# Patient Record
Sex: Male | Born: 1980 | Race: Black or African American | Hispanic: No | Smoking: Current every day smoker
Health system: Southern US, Community
[De-identification: ages and names within clinical notes are randomized; demographics above are authoritative.]

---

## 2021-07-13 ENCOUNTER — Ambulatory Visit
Admission: EM | Admit: 2021-07-13 | Discharge: 2021-07-13 | Disposition: A | Discharge status: Home / Self Care | Payer: BC Managed Care – PPO | Attending: Physician Assistant | Admitting: Physician Assistant

## 2021-07-13 ENCOUNTER — Ambulatory Visit (INDEPENDENT_AMBULATORY_CARE_PROVIDER_SITE_OTHER): Payer: BC Managed Care – PPO

## 2021-07-13 ENCOUNTER — Other Ambulatory Visit: Payer: Self-pay

## 2021-07-13 ENCOUNTER — Encounter: Payer: Self-pay | Admitting: Emergency Medicine

## 2021-07-13 DIAGNOSIS — M25512 Pain in left shoulder: Secondary | ICD-10-CM | POA: Diagnosis not present

## 2021-07-13 DIAGNOSIS — W19XXXA Unspecified fall, initial encounter: Secondary | ICD-10-CM | POA: Diagnosis not present

## 2021-07-13 MED ORDER — NAPROXEN 500 MG PO TABS: 500.0000 mg | ORAL_TABLET | Freq: Two times a day (BID) | ORAL | 0 refills | Status: AC

## 2021-07-13 NOTE — ED Triage Notes (Signed)
Pt c/o left shoulder pain. Started about 2 days ago. He states he was running at the pool and slipped on concrete and landed on his shoulder. He is unable to move shoulder at all.

## 2021-07-13 NOTE — Discharge Instructions (Addendum)
-  Naproxen, 1 tablet every 12 hours for pain inflammation -Warm compresses few times a day -Other care and range of motion exercises as attached -arm sling while upright for support -If no improvement in 4 to 5 days, would follow-up with orthopedics.  [facility] does have an orthopedic urgent care.

## 2021-07-13 NOTE — ED Provider Notes (Signed)
[facility]    CSN: [id number] Arrival date & time: 07/13/21  7035      History   Chief Complaint Chief Complaint  Patient presents with   Shoulder Injury    left    HPI Alec Watkins is a 40 y.o. male.   Patient is a 40 year old male who presents with complaint of left shoulder pain.  Patient states he was at the pool on Saturday when he fell, landing on his left shoulder.  Patient states he is unable to move the shoulder.  He is not taking any medications for it but has to use icy hot which only works for a very short time.  Patient has used no cold or heat compresses.  Patient denies any previous injury to the shoulder.  Patient does report some minimal tingling in his fingers.   History reviewed. No pertinent past medical history.  There are no problems to display for this patient.   History reviewed. No pertinent surgical history.   Home Medications    Prior to Admission medications   Medication Sig Start Date End Date Taking? Authorizing Provider  naproxen (NAPROSYN) 500 MG tablet Take 1 tablet (500 mg total) by mouth 2 (two) times daily. 07/13/21  Yes Candis Schatz, PA-C    Family History No family history on file.  Social History Social History   Tobacco Use   Smoking status: Every Day    Packs/day: 0.50    Types: Cigarettes   Smokeless tobacco: Never  Vaping Use   Vaping Use: Never used  Substance Use Topics   Alcohol use: Not Currently   Drug use: Not Currently     Allergies   Patient has no known allergies.   Review of Systems Review of Systems as noted above in HPI.  Other systems reviewed and found to be negative   Physical Exam Triage Vital Signs ED Triage Vitals  Enc Vitals Group     BP 07/13/21 0915 (!) 145/88     Pulse Rate 07/13/21 0915 93     Resp 07/13/21 0915 18     Temp 07/13/21 0915 98.9 F (37.2 C)     Temp Source 07/13/21 0915 Oral     SpO2 07/13/21 0915 97 %     Weight 07/13/21 0913 270 lb (122.5 kg)      Height 07/13/21 0913 6\' 4"  (1.93 m)     Head Circumference --      Watkins Flow --      Pain Score 07/13/21 0913 10     Pain Loc --      Pain Edu? --      Excl. in GC? --    No data found.  Updated Vital Signs BP (!) 145/88 (BP Location: Right Arm)   Pulse 93   Temp 98.9 F (37.2 C) (Oral)   Resp 18   Ht 6\' 4"  (1.93 m)   Wt 270 lb (122.5 kg)   SpO2 97%   BMI 32.87 kg/m     Physical Exam Constitutional:      Appearance: Normal appearance. He is not ill-appearing.  Pulmonary:     Effort: Pulmonary effort is normal.  Musculoskeletal:     Right shoulder: Normal.     Left shoulder: Tenderness and bony tenderness present. Decreased range of motion.     Cervical back: Normal range of motion.     Comments: Left shoulder with tenderness to palpation of the shoulder joint itself.  Decreased range of motion.  Less discomfort with backward extension of the shoulder joint.  Worse with abduction.  Skin:    General: Skin is warm and dry.  Neurological:     General: No focal deficit present.     Mental Status: He is alert and oriented to person, place, and time.     UC Treatments / Results  Labs (all labs ordered are listed, but only abnormal results are displayed) Labs Reviewed - No data to display  EKG   Radiology DG Shoulder Left  Result Date: 07/13/2021 CLINICAL DATA:  Left shoulder pain after fall 2 days ago. EXAM: LEFT SHOULDER - 2+ VIEW COMPARISON:  None. FINDINGS: There is no evidence of fracture or dislocation. There is no evidence of arthropathy or other focal bone abnormality. Soft tissues are unremarkable. IMPRESSION: Negative. Electronically Signed   By: Obie Dredge M.D.   On: 07/13/2021 09:45    Procedures Procedures (including critical care time)  Medications Ordered in UC Medications - No data to display  Initial Impression / Assessment and Plan / UC Course  I have reviewed the triage vital signs and the nursing notes.  Pertinent labs & imaging  results that were available during my care of the patient were reviewed by me and considered in my medical decision making (see chart for details).    Patient presents with left shoulder pain after reportedly slipping and falling at the pool, landing on the concrete on his left shoulder.  Patient with decreased range of motion.  Tenderness palpation of the shoulder joint.  X-ray negative for any acute fracture.  I will go ahead and put the patient in a sling.- Final Clinical Impressions(s) / UC Diagnoses   Final diagnoses:  Acute pain of left shoulder  Fall, initial encounter   Discharge Instructions   None    ED Prescriptions     Medication Sig Dispense Auth. Provider   naproxen (NAPROSYN) 500 MG tablet Take 1 tablet (500 mg total) by mouth 2 (two) times daily. 30 tablet Candis Schatz, PA-C      PDMP not reviewed this encounter.   Candis Schatz, PA-C 07/13/21 [phone]

## 2022-10-22 IMAGING — CR DG SHOULDER 2+V*L*
3 series · 3 of 3 positions shown · non-contrast
Comparison: None.

CLINICAL DATA: Left shoulder pain after fall 2 days ago.

EXAM:
LEFT SHOULDER - 2+ VIEW

[shoulder grashey]
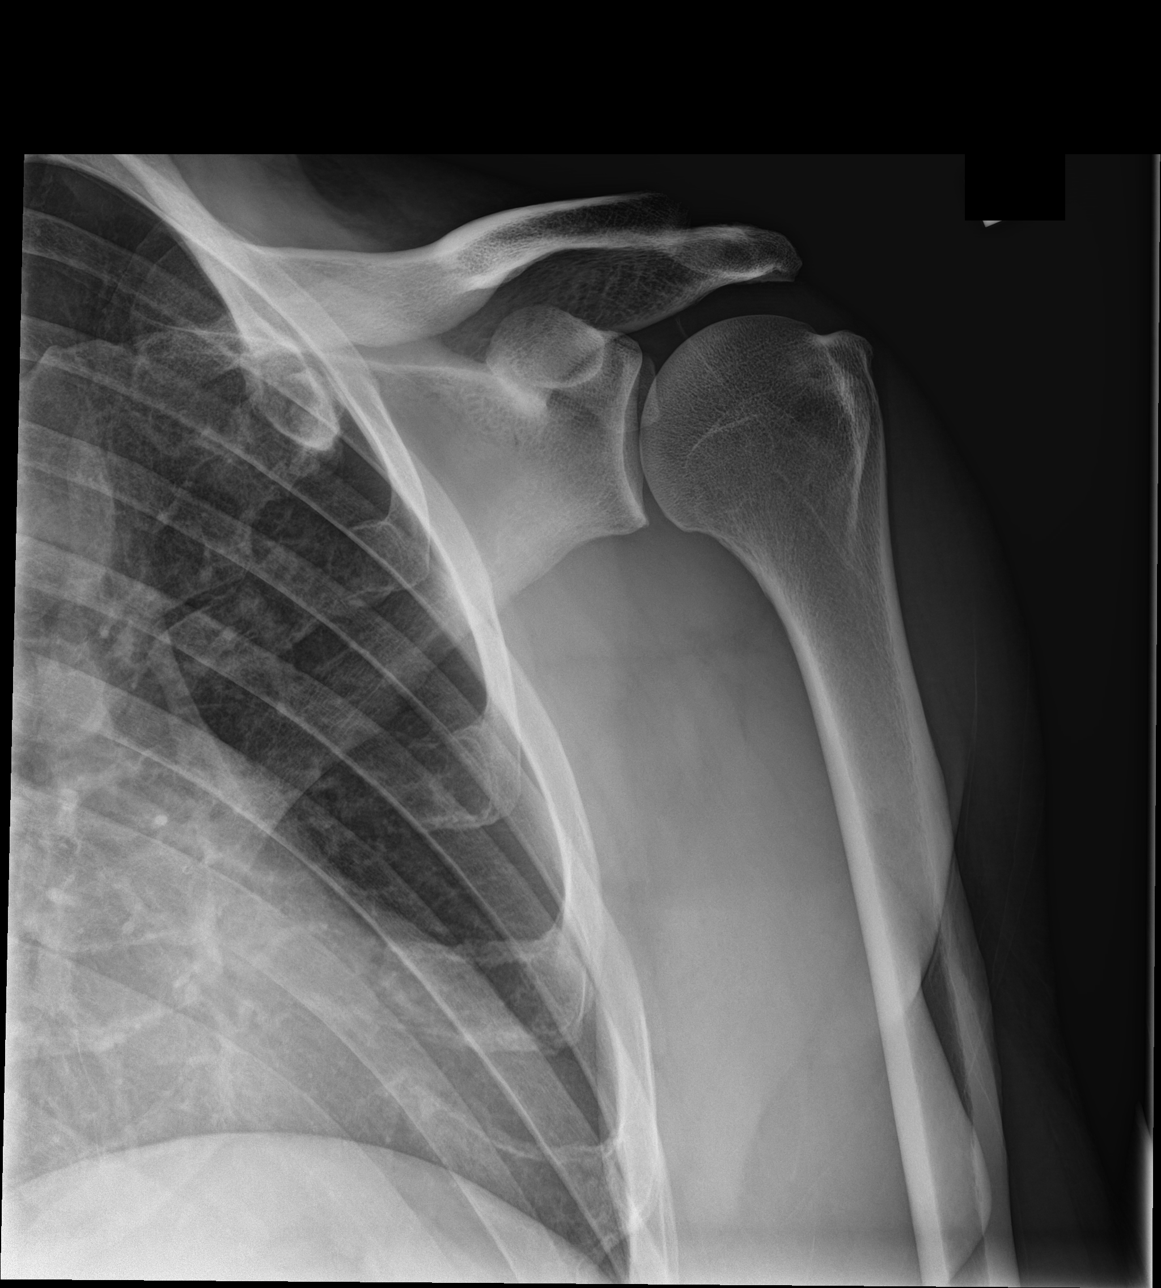

[shoulder y view]
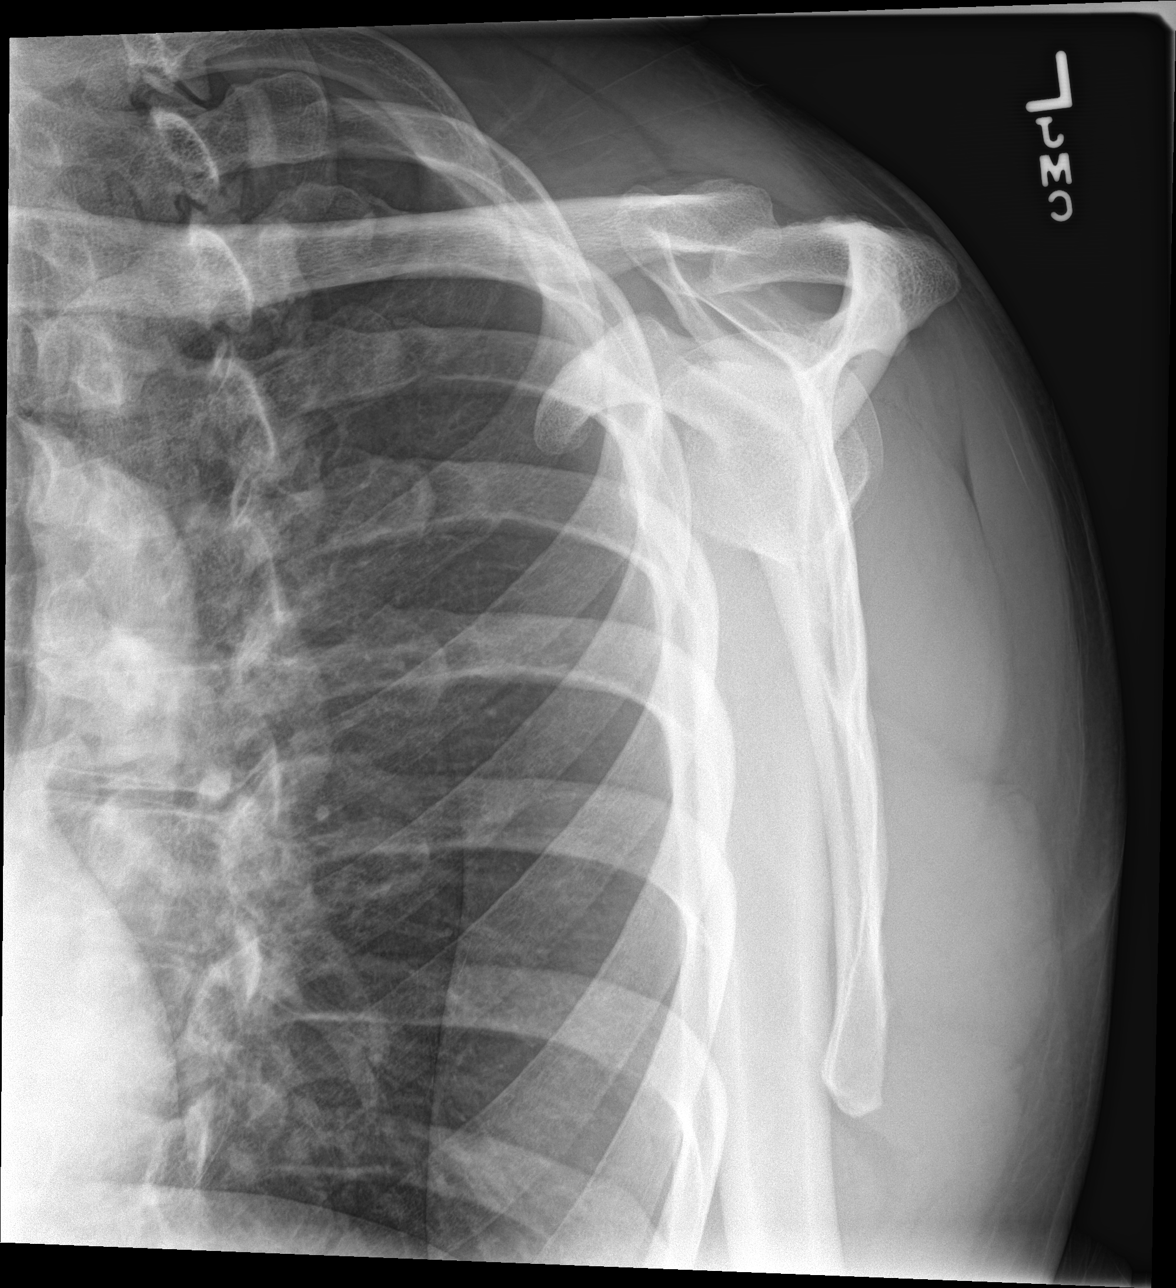

[shoulder axial]
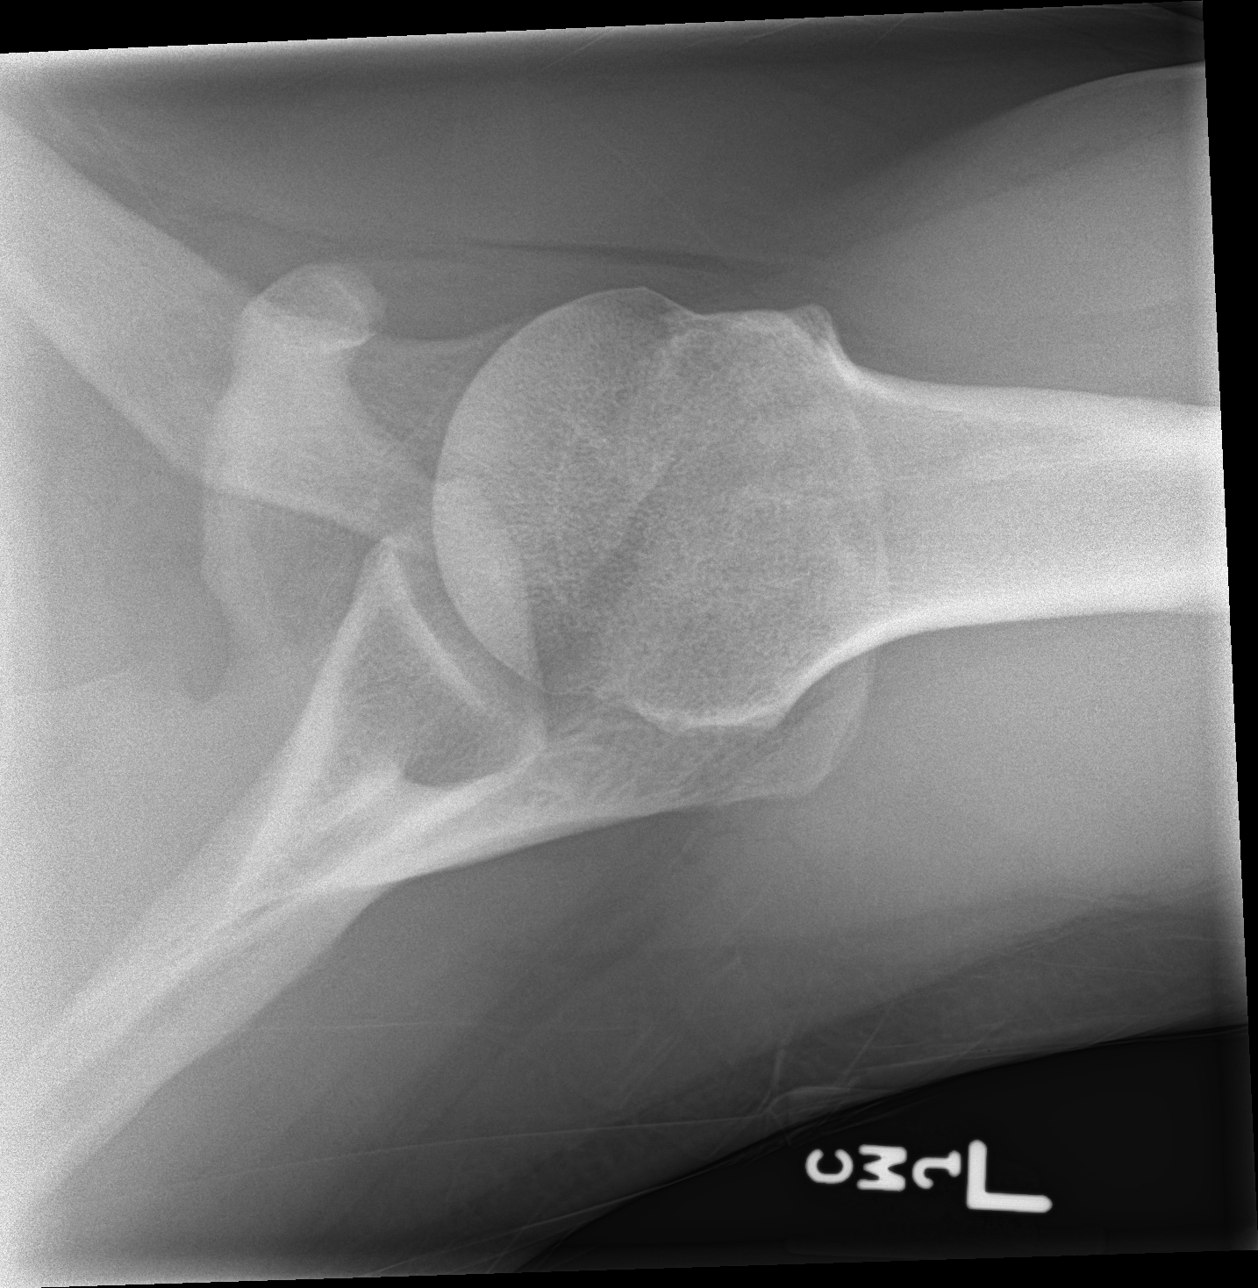

[3 of 3 positions shown; findings below may reference images not displayed]

FINDINGS: There is no evidence of fracture or dislocation. There is no
evidence of arthropathy or other focal bone abnormality. Soft
tissues are unremarkable.
IMPRESSION: Negative.
